# Patient Record
Sex: Male | Born: 1995 | Race: Black or African American | Hispanic: No | Marital: Single | Smoking: Current every day smoker
Health system: Southern US, Community
[De-identification: ages and names within clinical notes are randomized; demographics above are authoritative.]

---

## 2020-11-28 ENCOUNTER — Emergency Department (HOSPITAL_COMMUNITY)
Admission: EM | Admit: 2020-11-28 | Discharge: 2020-11-28 | Disposition: A | Discharge status: Home / Self Care | Payer: Self-pay | Attending: Emergency Medicine | Admitting: Emergency Medicine

## 2020-11-28 ENCOUNTER — Other Ambulatory Visit: Payer: Self-pay

## 2020-11-28 ENCOUNTER — Emergency Department (HOSPITAL_COMMUNITY): Payer: Self-pay

## 2020-11-28 ENCOUNTER — Encounter (HOSPITAL_COMMUNITY): Payer: Self-pay | Admitting: Emergency Medicine

## 2020-11-28 DIAGNOSIS — F17200 Nicotine dependence, unspecified, uncomplicated: Secondary | ICD-10-CM | POA: Insufficient documentation

## 2020-11-28 DIAGNOSIS — M542 Cervicalgia: Secondary | ICD-10-CM | POA: Insufficient documentation

## 2020-11-28 DIAGNOSIS — R0789 Other chest pain: Secondary | ICD-10-CM | POA: Insufficient documentation

## 2020-11-28 DIAGNOSIS — R202 Paresthesia of skin: Secondary | ICD-10-CM | POA: Insufficient documentation

## 2020-11-28 LAB — BASIC METABOLIC PANEL
Anion gap: 7 (ref 5–15)
BUN: 6 mg/dL (ref 6–20)
CO2: 27 mmol/L (ref 22–32)
Calcium: 9.4 mg/dL (ref 8.9–10.3)
Chloride: 104 mmol/L (ref 98–111)
Creatinine, Ser: 0.92 mg/dL (ref 0.61–1.24)
GFR, Estimated: >60 mL/min (ref >60)
Glucose, Bld: 88 mg/dL (ref 70–99)
Potassium: 3.7 mmol/L (ref 3.5–5.1)
Sodium: 138 mmol/L (ref 135–145)

## 2020-11-28 LAB — CBC
HCT: 42.5 % (ref 39.0–52.0)
Hemoglobin: 14.1 g/dL (ref 13.0–17.0)
MCH: 28.1 pg (ref 26.0–34.0)
MCHC: 33.2 g/dL (ref 30.0–36.0)
MCV: 84.8 fL (ref 80.0–100.0)
Platelets: 295 10*3/uL (ref 150–400)
RBC: 5.01 MIL/uL (ref 4.22–5.81)
RDW: 12.7 % (ref 11.5–15.5)
WBC: 8.4 10*3/uL (ref 4.0–10.5)
nRBC: 0 % (ref 0.0–0.2)

## 2020-11-28 LAB — TROPONIN I (HIGH SENSITIVITY): Troponin I (High Sensitivity): 4 ng/L (ref <18)

## 2020-11-28 NOTE — ED Provider Notes (Signed)
[facility] EMERGENCY DEPARTMENT Provider Note   CSN: [id number] Arrival date & time: 11/28/20  1656     History Chief Complaint  Patient presents with  . Chest Pain    Malik Washington is a 25 y.o. male.  The history is provided by the patient.  Chest Pain  Malik Washington is a 25 y.o. male who presents to the Emergency Department complaining of chest pain. He presents the emergency department complaining of one week of left sided chest pain. Pain is located in the axillary to central left chest. It is a constant tightness that is worse with moving and leaning forward. He has associated discomfort to the left side of his neck and intermittent tingling in the left arm. He denies any fevers. He states that at times he feels like he needs to take a deep breath. He feels clammy at times. No nausea, vomiting, abdominal pain. He smokes tobacco. He has no known medical problems. He is a family history of hypertension and diabetes. No family history of coronary artery disease. No alcohol or drug use. He was seen in urgent care referred to the emergency department due to an abnormal EKG. EKG personally reviewed. EKG with early repolarization.    History reviewed. No pertinent past medical history.  There are no problems to display for this patient.   History reviewed. No pertinent surgical history.     No family history on file.  Social History   Tobacco Use  . Smoking status: Current Every Day Smoker  . Smokeless tobacco: Never Used  Substance Use Topics  . Alcohol use: Not Currently  . Drug use: Yes    Types: Marijuana    Home Medications Prior to Admission medications   Not on File    Allergies    Patient has no allergy information on record.  Review of Systems   Review of Systems  Cardiovascular: Positive for chest pain.  All other systems reviewed and are negative.   Physical Exam Updated Vital Signs BP (!) 155/96   Pulse 68   Temp 97.9 F (36.6  C)   Resp 14   SpO2 100%   Physical Exam Vitals and nursing note reviewed.  Constitutional:      Appearance: He is well-developed.  HENT:     Head: Normocephalic and atraumatic.  Cardiovascular:     Rate and Rhythm: Normal rate and regular rhythm.     Heart sounds: No murmur heard.   Pulmonary:     Effort: Pulmonary effort is normal. No respiratory distress.     Breath sounds: Normal breath sounds.  Chest:     Chest wall: Tenderness present.  Abdominal:     Palpations: Abdomen is soft.     Tenderness: There is no abdominal tenderness. There is no guarding or rebound.  Musculoskeletal:        General: No swelling or tenderness.     Cervical back: Neck supple.     Comments: 2+ radial pulses bilaterally.  Skin:    General: Skin is warm and dry.  Neurological:     Mental Status: He is alert and oriented to person, place, and time.     Comments: Five out of five strength and proximal and distal bilateral upper extremities with sensational light touch intact and bilateral upper extremities  Psychiatric:        Behavior: Behavior normal.     ED Results / Procedures / Treatments   Labs (all labs ordered are listed, but only  abnormal results are displayed) Labs Reviewed  BASIC METABOLIC PANEL  CBC  TROPONIN I (HIGH SENSITIVITY)    EKG EKG Interpretation  Date/Time:  Saturday November 28 2020 17:14:24 EST Ventricular Rate:  65 PR Interval:  154 QRS Duration: 88 QT Interval:  358 QTC Calculation: 372 R Axis:   87 Text Interpretation: Normal sinus rhythm Normal ECG No previous tracing Confirmed by Tilden Fossa [phone]) on 11/28/2020 7:04:24 PM   Radiology DG Chest 2 View  Result Date: 11/28/2020 CLINICAL DATA:  Chest pain EXAM: CHEST - 2 VIEW COMPARISON:  None. FINDINGS: Normal heart size. Normal mediastinal contour. No pneumothorax. No pleural effusion. Lungs appear clear, with no acute consolidative airspace disease and no pulmonary edema. IMPRESSION: No active  cardiopulmonary disease. Electronically Signed   By: Delbert Phenix M.D.   On: 11/28/2020 17:56    Procedures Procedures   Medications Ordered in ED Medications - No data to display  ED Course  I have reviewed the triage vital signs and the nursing notes.  Pertinent labs & imaging results that were available during my care of the patient were reviewed by me and considered in my medical decision making (see chart for details).    MDM Rules/Calculators/A&P                         For evaluation of one week of left sided chest pain. Pain is reproducible on examination. He is neurovascularly intact. EKG without acute ischemic changes in troponin is negative. Presentation is not consistent with ACS, PE, dissection, hypertensive urgency. Discussed with patient home care for chest wall pain. Discussed outpatient follow-up and return precautions.  Final Clinical Impression(s) / ED Diagnoses Final diagnoses:  Chest wall pain    Rx / DC Orders ED Discharge Orders    None       Tilden Fossa, MD 11/28/20 1939

## 2020-11-28 NOTE — ED Triage Notes (Signed)
Pt reports L sided chest tightness, L arm tingling, L neck and jaw tightness, and bilateral feet swelling x 1 week.

## 2022-12-16 ENCOUNTER — Other Ambulatory Visit: Payer: Self-pay

## 2022-12-16 ENCOUNTER — Encounter (HOSPITAL_COMMUNITY): Payer: Self-pay | Admitting: Emergency Medicine

## 2022-12-16 ENCOUNTER — Emergency Department (HOSPITAL_COMMUNITY)
Admission: EM | Admit: 2022-12-16 | Discharge: 2022-12-17 | Disposition: A | Discharge status: Home / Self Care | Payer: Self-pay | Attending: Emergency Medicine | Admitting: Emergency Medicine

## 2022-12-16 DIAGNOSIS — L0291 Cutaneous abscess, unspecified: Secondary | ICD-10-CM

## 2022-12-16 DIAGNOSIS — L03317 Cellulitis of buttock: Secondary | ICD-10-CM | POA: Insufficient documentation

## 2022-12-16 DIAGNOSIS — L0231 Cutaneous abscess of buttock: Secondary | ICD-10-CM | POA: Insufficient documentation

## 2022-12-16 MED ORDER — OXYCODONE-ACETAMINOPHEN 5-325 MG PO TABS
1.0000 | ORAL_TABLET | Freq: Once | ORAL | Status: AC
  Administered 2022-12-16: 1 via ORAL
  Filled 2022-12-16: qty 1

## 2022-12-16 NOTE — ED Provider Triage Note (Signed)
Emergency Medicine Provider Triage Evaluation Note  Malik Washington , a 27 y.o. male  was evaluated in triage.  Pt complains of left buttock abscess x1 week.  Seen at [facility] and sent here for I&D.  He is able to have BM's, no bleeding.  No fevers.  Review of Systems  Positive: Buttock abscess Negative: fever  Physical Exam  BP (!) 160/100   Pulse 80   Temp 98.2 F (36.8 C) (Oral)   Resp 20   SpO2 99%  Gen:   Awake, no distress   Resp:  Normal effort  MSK:   Moves extremities without difficulty  Other:  Chaperoned by triage RN-- abscess present along left medial buttock, there is central fluctuance, this does not appear to be directly peri-rectal  Medical Decision Making  Medically screening exam initiated at 10:45 PM.  Appropriate orders placed.  Adalberto Cole was informed that the remainder of the evaluation will be completed by another provider, this initial triage assessment does not replace that evaluation, and the importance of remaining in the ED until their evaluation is complete.  Buttock abscess.  Will need I&D.   Larene Pickett, PA-C 12/16/22 2246

## 2022-12-16 NOTE — ED Triage Notes (Signed)
Pt seen at [facility] today. Has anorectal abscess. He was given oral antibiotics at [facility]. Non draining. Pain for a week but increased over past few days. Painful with BM. No bleeding.

## 2022-12-17 MED ORDER — OXYCODONE-ACETAMINOPHEN 5-325 MG PO TABS: 2.0000 | ORAL_TABLET | ORAL | 0 refills | Status: DC | PRN

## 2022-12-17 MED ORDER — LIDOCAINE-PRILOCAINE 2.5-2.5 % EX CREA
TOPICAL_CREAM | Freq: Once | CUTANEOUS | Status: AC
  Filled 2022-12-17: qty 5

## 2022-12-17 MED ORDER — CEPHALEXIN 250 MG PO CAPS
1000.0000 mg | ORAL_CAPSULE | Freq: Once | ORAL | Status: AC
  Administered 2022-12-17: 1000 mg via ORAL
  Filled 2022-12-17: qty 4

## 2022-12-17 MED ORDER — HYDROMORPHONE HCL 1 MG/ML IJ SOLN
1.0000 mg | Freq: Once | INTRAMUSCULAR | Status: AC
  Administered 2022-12-17: 1 mg via INTRAMUSCULAR
  Filled 2022-12-17: qty 1

## 2022-12-17 MED ORDER — LIDOCAINE-EPINEPHRINE (PF) 2 %-1:200000 IJ SOLN
20.0000 mL | Freq: Once | INTRAMUSCULAR | Status: AC
  Administered 2022-12-17: 20 mL
  Filled 2022-12-17: qty 20

## 2022-12-17 MED ORDER — SULFAMETHOXAZOLE-TRIMETHOPRIM 800-160 MG PO TABS: 1.0000 | ORAL_TABLET | Freq: Two times a day (BID) | ORAL | 0 refills | Status: AC

## 2022-12-17 NOTE — Discharge Instructions (Signed)
Abscesses and cellulitis are often caused by bacteria that live on your skin naturally all the time.  One way to get rid of this is to put a cup of bleach in a bathtub of water soaking for 10 minutes a day for a week.  If you do this, realize the bleach will stain your clothes, towels, carpets, rugs and anything else with die.  It will also make your feet slippery when used about a tub to be careful not to fall.  Another thing that works very well is chlorhexidine washes.  You can buy chlorhexidine wipes at the store and cleanse your whole body with them once a day for 7 days along with putting mupirocin ointment in your nose and finding chlorhexidine mouthwash to rinse her mouth with twice a day.  

## 2022-12-17 NOTE — ED Provider Notes (Signed)
[facility] Provider Note   CSN: [id number] Arrival date & time: 12/16/22  2207     History  Chief Complaint  Patient presents with   Abscess    Malik Washington is a 27 y.o. male.  27 year old male who presents ER today with pain to his left inner buttock.  Patient states has been off about a week.  She went to urgent care and they told him it might be an abscess and you come to ER to get it drained.  No fevers.  No antibiotics.  Has tried ibuprofen without help.  Has had abscesses before. No drainage. No bowel changes.   Abscess      Home Medications Prior to Admission medications   Medication Sig Start Date End Date Taking? Authorizing Provider  oxyCODONE-acetaminophen (PERCOCET) 5-325 MG tablet Take 2 tablets by mouth every 4 (four) hours as needed. 12/17/22  Yes Aidon Klemens, Corene Cornea, MD  sulfamethoxazole-trimethoprim (BACTRIM DS) 800-160 MG tablet Take 1 tablet by mouth 2 (two) times daily for 7 days. 12/17/22 12/24/22 Yes Askari Kinley, Corene Cornea, MD      Allergies    Patient has no known allergies.    Review of Systems   Review of Systems  Physical Exam Updated Vital Signs BP (!) 146/86   Pulse 94   Temp 98.2 F (36.8 C) (Oral)   Resp 19   SpO2 100%  Physical Exam Vitals and nursing note reviewed.  Constitutional:      Appearance: He is well-developed.  HENT:     Head: Normocephalic and atraumatic.     Nose: No congestion or rhinorrhea.  Cardiovascular:     Rate and Rhythm: Normal rate.  Pulmonary:     Effort: Pulmonary effort is normal. No respiratory distress.  Abdominal:     General: There is no distension.  Genitourinary:    Comments: Moderate size abscess in gluteal cleft on left buttock, fluctant, ttp, warm Musculoskeletal:        General: Normal range of motion.     Cervical back: Normal range of motion.  Neurological:     Mental Status: He is alert.     ED Results / Procedures / Treatments   Labs (all labs  ordered are listed, but only abnormal results are displayed) Labs Reviewed - No data to display  EKG None  Radiology No results found.  Procedures .Marland KitchenIncision and Drainage  Date/Time: 12/17/2022 6:48 AM  Performed by: Merrily Pew, MD Authorized by: Merrily Pew, MD   Consent:    Consent obtained:  Verbal   Consent given by:  Patient   Risks, benefits, and alternatives were discussed: yes     Risks discussed:  Bleeding, incomplete drainage, pain, infection and damage to other organs   Alternatives discussed:  No treatment Universal protocol:    Procedure explained and questions answered to patient or proxy's satisfaction: yes     Relevant documents present and verified: yes     Patient identity confirmed:  Verbally with patient Location:    Type:  Abscess   Size:  3x3   Location:  Anogenital   Anogenital location:  Gluteal cleft Pre-procedure details:    Skin preparation:  Povidone-iodine Sedation:    Sedation type:  None Anesthesia:    Anesthesia method:  Topical application and local infiltration   Topical anesthetic:  EMLA cream   Local anesthetic:  Lidocaine 2% WITH epi Procedure type:    Complexity:  Simple Procedure details:    Ultrasound guidance:  no     Incision types:  Single straight   Wound management:  Probed and deloculated and irrigated with saline   Drainage:  Purulent   Drainage amount:  Copious   Wound treatment:  Wound left open   Packing materials:  None Post-procedure details:    Procedure completion:  Tolerated     Medications Ordered in ED Medications  oxyCODONE-acetaminophen (PERCOCET/ROXICET) 5-325 MG per tablet 1 tablet (1 tablet Oral Given 12/16/22 2251)  HYDROmorphone (DILAUDID) injection 1 mg (1 mg Intramuscular Given 12/17/22 0504)  lidocaine-EPINEPHrine (XYLOCAINE W/EPI) 2 %-1:200000 (PF) injection 20 mL (20 mLs Infiltration Given 12/17/22 0504)  lidocaine-prilocaine (EMLA) cream ( Topical Given 12/17/22 0503)  cephALEXin (KEFLEX)  capsule 1,000 mg (1,000 mg Oral Given 12/17/22 [id number])    ED Course/ Medical Decision Making/ A&P                             Medical Decision Making Risk Prescription drug management.   Patient with a gluteal cleft abscess with moderate drainage.  Antibiotics started.  No evidence of sepsis.  Short course of pain medications provided as well.  Discussed chlorhexidine washes, sitz bath's and reasons to return to the ER.  Significant other is a pharmacist and was also explained his directions.   Final Clinical Impression(s) / ED Diagnoses Final diagnoses:  Abscess  Cellulitis of buttock    Rx / DC Orders ED Discharge Orders          Ordered    sulfamethoxazole-trimethoprim (BACTRIM DS) 800-160 MG tablet  2 times daily        12/17/22 0611    oxyCODONE-acetaminophen (PERCOCET) 5-325 MG tablet  Every 4 hours PRN        12/17/22 0611              Saadia Dewitt, Corene Cornea, MD 12/17/22 [phone]

## 2023-03-11 IMAGING — CR DG CHEST 2V
2 series · 2 of 2 positions shown · non-contrast
Comparison: None.

CLINICAL DATA: Chest pain

EXAM:
CHEST - 2 VIEW

[chest pa]
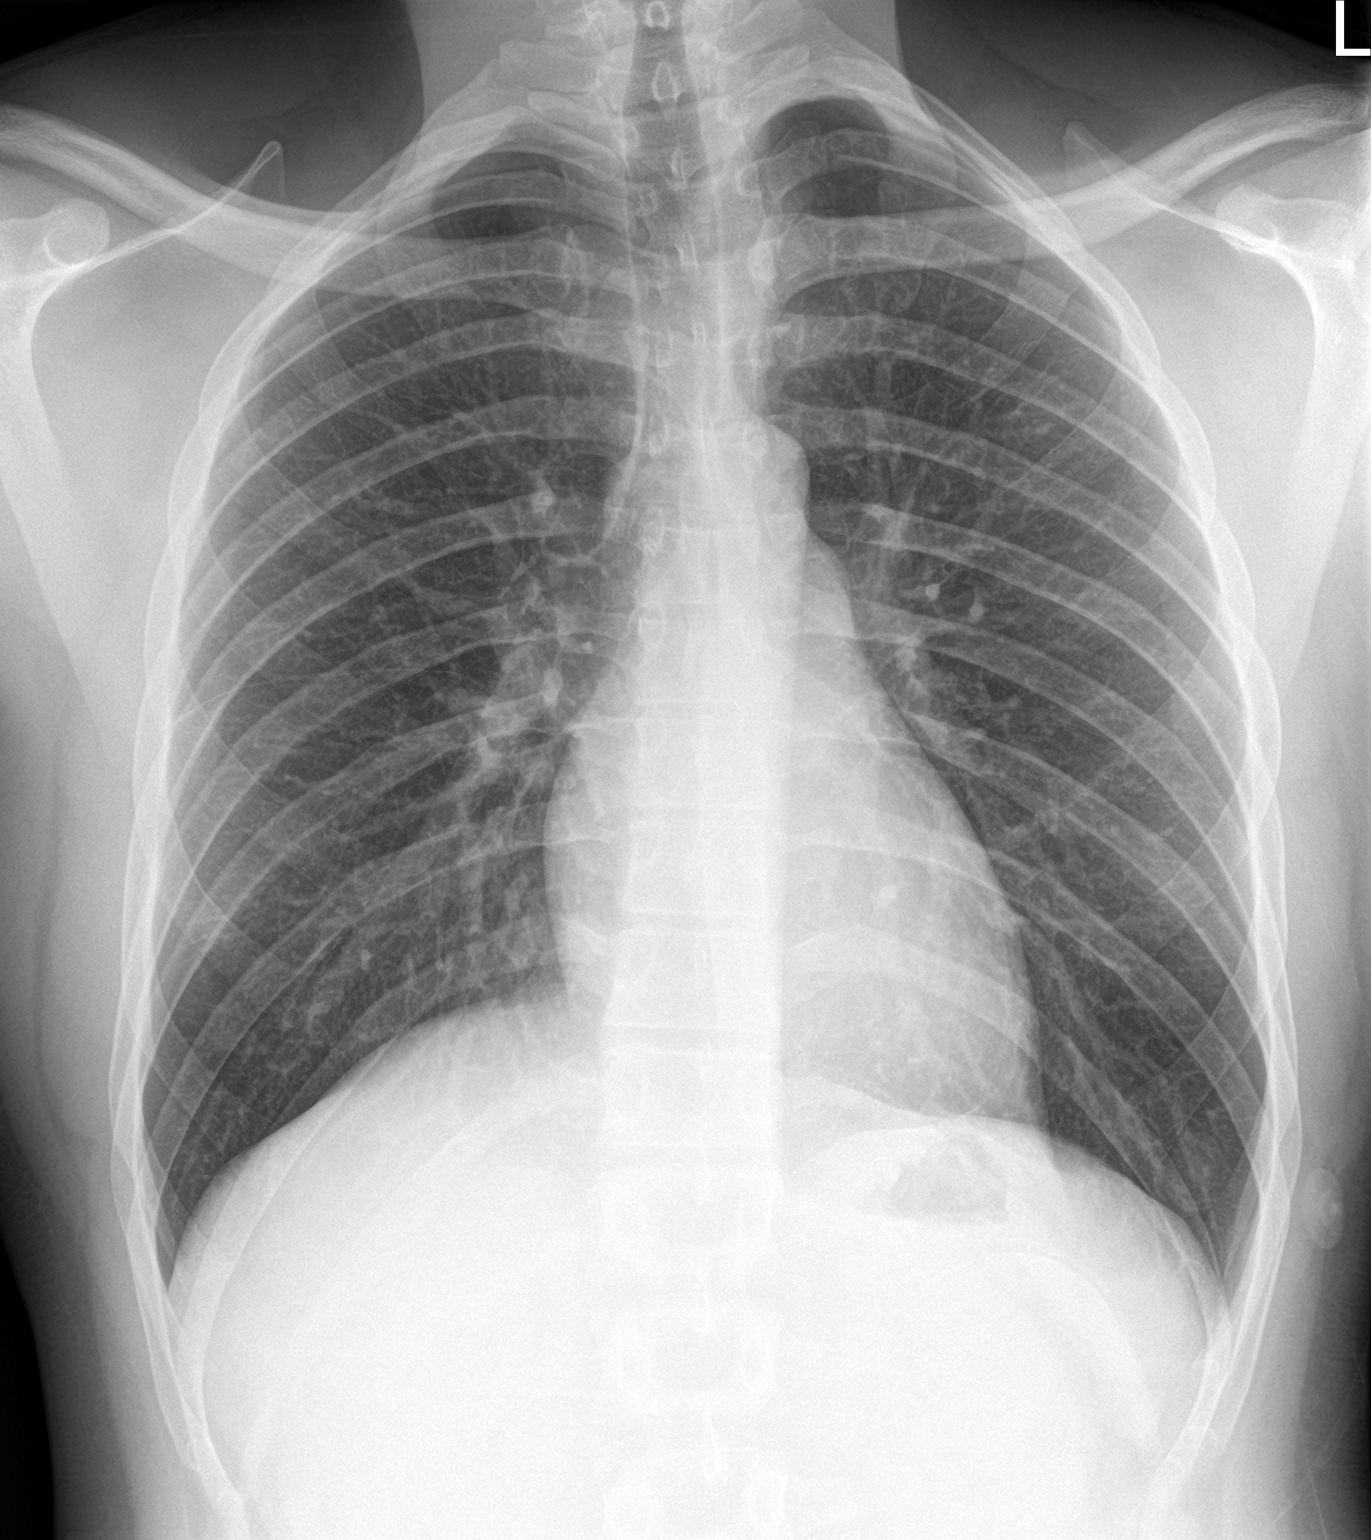

[chest lat]
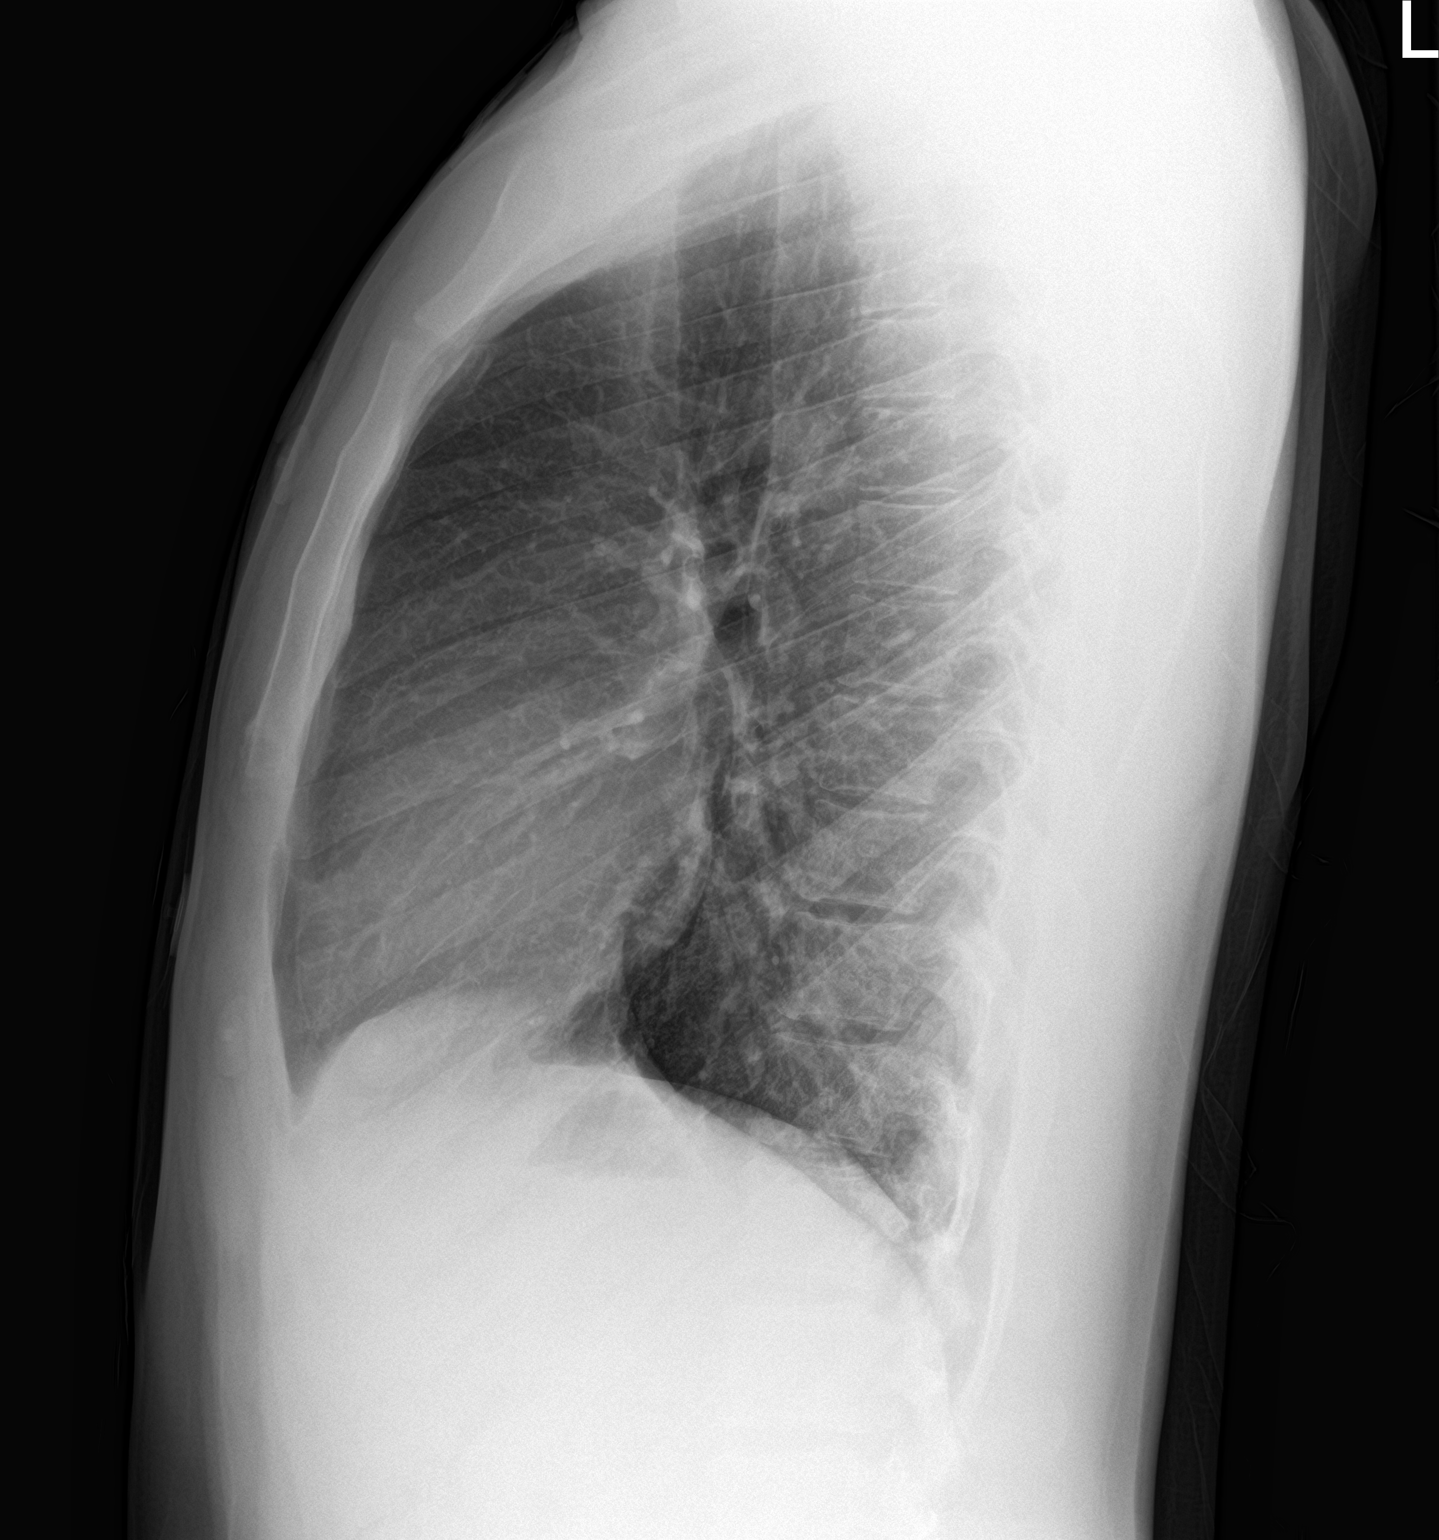

[2 of 2 positions shown; findings below may reference images not displayed]

FINDINGS: Normal heart size. Normal mediastinal contour. No pneumothorax. No
pleural effusion. Lungs appear clear, with no acute consolidative
airspace disease and no pulmonary edema.
IMPRESSION: No active cardiopulmonary disease.

## 2023-06-25 ENCOUNTER — Encounter (HOSPITAL_BASED_OUTPATIENT_CLINIC_OR_DEPARTMENT_OTHER): Payer: Self-pay | Admitting: Emergency Medicine

## 2023-06-25 ENCOUNTER — Other Ambulatory Visit: Payer: Self-pay

## 2023-06-25 ENCOUNTER — Emergency Department (HOSPITAL_BASED_OUTPATIENT_CLINIC_OR_DEPARTMENT_OTHER)
Admission: EM | Admit: 2023-06-25 | Discharge: 2023-06-25 | Disposition: A | Discharge status: Home / Self Care | Payer: Self-pay | Attending: Emergency Medicine | Admitting: Emergency Medicine

## 2023-06-25 DIAGNOSIS — L0501 Pilonidal cyst with abscess: Secondary | ICD-10-CM | POA: Insufficient documentation

## 2023-06-25 MED ORDER — OXYCODONE-ACETAMINOPHEN 5-325 MG PO TABS: 1.0000 | ORAL_TABLET | ORAL | 0 refills | Status: DC | PRN

## 2023-06-25 MED ORDER — SULFAMETHOXAZOLE-TRIMETHOPRIM 800-160 MG PO TABS: 1.0000 | ORAL_TABLET | Freq: Two times a day (BID) | ORAL | 0 refills | Status: AC

## 2023-06-25 MED ORDER — LIDOCAINE-EPINEPHRINE (PF) 2 %-1:200000 IJ SOLN
10.0000 mL | Freq: Once | INTRAMUSCULAR | Status: AC
  Administered 2023-06-25: 10 mL via INTRADERMAL
  Filled 2023-06-25: qty 20

## 2023-06-25 MED ORDER — OXYCODONE-ACETAMINOPHEN 5-325 MG PO TABS: 1.0000 | ORAL_TABLET | ORAL | 0 refills | Status: AC | PRN

## 2023-06-25 NOTE — ED Triage Notes (Signed)
Pt presents with reported cyst to gluteal cleft.  Has had this in the past. Had to have it drained.  Pt reports no fever or chills but constant pain.  Has been there for a few days and gotten worse.

## 2023-06-25 NOTE — ED Provider Notes (Signed)
[place] EMERGENCY DEPARTMENT AT [facility] Provider Note   CSN: [id number] Arrival date & time: 06/25/23  1850     History {Add pertinent medical, surgical, social history, OB history to HPI:1} Chief Complaint  Patient presents with   Cyst    Malik Washington is a 27 y.o. male.  27 year old male with history of pilonidal cyst who presents emergency department with cyst on his bottom.  Patient reports that for several days has been having some pain near his rectum.  No fevers or chills.  Occasionally has pain with bowel movements.  Had a pilonidal cyst years ago but has not had one drained yet.       Home Medications Prior to Admission medications   Medication Sig Start Date End Date Taking? Authorizing Provider  oxyCODONE-acetaminophen (PERCOCET) 5-325 MG tablet Take 2 tablets by mouth every 4 (four) hours as needed. 12/17/22   Mesner, Barbara Cower, MD      Allergies    Patient has no known allergies.    Review of Systems   Review of Systems  Physical Exam Updated Vital Signs BP (!) 140/87 (BP Location: Right Arm)   Pulse (!) 103   Temp 98.5 F (36.9 C)   Resp 18   SpO2 99%  Physical Exam Genitourinary:    Comments: Chaperoned by RN.  2 cm diameter area of fluctuance palpated.    ED Results / Procedures / Treatments   Labs (all labs ordered are listed, but only abnormal results are displayed) Labs Reviewed - No data to display  EKG None  Radiology No results found.  Procedures .Marland KitchenIncision and Drainage  Date/Time: 06/25/2023 9:59 PM  Performed by: Rondel Baton, MD Authorized by: Rondel Baton, MD   Consent:    Consent obtained:  Verbal   Consent given by:  Patient   Risks, benefits, and alternatives were discussed: yes     Risks discussed:  Bleeding, incomplete drainage and pain (Rectal injury)   Alternatives discussed:  No treatment and alternative treatment (Antibiotics) Universal protocol:    Procedure explained and questions  answered to patient or proxy's satisfaction: yes   Location:    Type:  Pilonidal cyst   Size:  2 cm   Location:  Anogenital   Anogenital location:  Pilonidal Pre-procedure details:    Skin preparation:  Chlorhexidine Sedation:    Sedation type:  None Anesthesia:    Anesthesia method:  Local infiltration   Local anesthetic:  Lidocaine 2% WITH epi Procedure type:    Complexity:  Simple Procedure details:    Ultrasound guidance: yes     Incision types:  Single straight   Wound management:  Probed and deloculated   Drainage:  Bloody and purulent   Drainage amount:  Moderate   Wound treatment:  Wound left open   Packing materials:  1/4 in iodoform gauze Post-procedure details:    Procedure completion:  Tolerated well, no immediate complications   {Document cardiac monitor, telemetry assessment procedure when appropriate:1}  EMERGENCY DEPARTMENT US SOFT TISSUE INTERPRETATION "Study: Limited Soft Tissue Ultrasound"  INDICATIONS: Pain Multiple views of the body part were obtained in real-time with a multi-frequency linear probe  PERFORMED BY: Myself IMAGES ARCHIVED?: No SIDE:Left BODY PART: Left gluteal cleft INTERPRETATION:  Abcess present approximately 2 x 2 x 3 cm    Medications Ordered in ED Medications  lidocaine-EPINEPHrine (XYLOCAINE W/EPI) 2 %-1:200000 (PF) injection 10 mL (has no administration in time range)    ED Course/ Medical Decision Making/  A&P   {   Click here for ABCD2, HEART and other calculatorsREFRESH Note before signing :1}                              Medical Decision Making Risk Prescription drug management.   ***  {Document critical care time when appropriate:1} {Document review of labs and clinical decision tools ie heart score, Chads2Vasc2 etc:1}  {Document your independent review of radiology images, and any outside records:1} {Document your discussion with family members, caretakers, and with consultants:1} {Document social  determinants of health affecting pt's care:1} {Document your decision making why or why not admission, treatments were needed:1} Final Clinical Impression(s) / ED Diagnoses Final diagnoses:  None    Rx / DC Orders ED Discharge Orders     None

## 2023-06-25 NOTE — Discharge Instructions (Signed)
You were seen for your pilonidal abscess in the emergency department.   At home, please take the Bactrim we have prescribed you.  Leave the packing in for the next 2 days and then you may remove it.  Take Tylenol and ibuprofen for the pain.  Check your MyChart online for the results of any tests that had not resulted by the time you left the emergency department.   Follow-up with your primary doctor or urgent care in 2-3 days regarding your visit if your symptoms or not improving.  Return immediately to the emergency department if you experience any of the following: Fevers, unbearable pain, or any other concerning symptoms.    Thank you for visiting our Emergency Department. It was a pleasure taking care of you today.
